# Patient Record
Sex: Female | Born: 1980 | Race: White | Hispanic: No | Marital: Married | Smoking: Never smoker
Health system: Southern US, Community
[De-identification: ages and names within clinical notes are randomized; demographics above are authoritative.]

## PROBLEM LIST (undated history)

## (undated) DIAGNOSIS — R0600 Dyspnea, unspecified: Secondary | ICD-10-CM

## (undated) DIAGNOSIS — F32A Depression, unspecified: Secondary | ICD-10-CM

## (undated) DIAGNOSIS — I10 Essential (primary) hypertension: Secondary | ICD-10-CM

## (undated) DIAGNOSIS — F419 Anxiety disorder, unspecified: Secondary | ICD-10-CM

## (undated) DIAGNOSIS — K589 Irritable bowel syndrome without diarrhea: Secondary | ICD-10-CM

## (undated) DIAGNOSIS — K219 Gastro-esophageal reflux disease without esophagitis: Secondary | ICD-10-CM

## (undated) DIAGNOSIS — F329 Major depressive disorder, single episode, unspecified: Secondary | ICD-10-CM

## (undated) HISTORY — PX: TONSILLECTOMY: SUR1361

## (undated) HISTORY — PX: OTHER SURGICAL HISTORY: SHX169

---
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
## 2013-09-10 ENCOUNTER — Emergency Department (HOSPITAL_COMMUNITY)
Admission: EM | Admit: 2013-09-10 | Discharge: 2013-09-11 | Disposition: A | Discharge status: Home / Self Care | Payer: Medicaid Other | Attending: Emergency Medicine | Admitting: Emergency Medicine

## 2013-09-10 ENCOUNTER — Encounter (HOSPITAL_COMMUNITY): Payer: Self-pay | Admitting: Emergency Medicine

## 2013-09-10 DIAGNOSIS — R110 Nausea: Secondary | ICD-10-CM | POA: Insufficient documentation

## 2013-09-10 DIAGNOSIS — K8020 Calculus of gallbladder without cholecystitis without obstruction: Secondary | ICD-10-CM

## 2013-09-10 DIAGNOSIS — R209 Unspecified disturbances of skin sensation: Secondary | ICD-10-CM | POA: Insufficient documentation

## 2013-09-10 DIAGNOSIS — Z3202 Encounter for pregnancy test, result negative: Secondary | ICD-10-CM | POA: Insufficient documentation

## 2013-09-10 DIAGNOSIS — R109 Unspecified abdominal pain: Secondary | ICD-10-CM | POA: Insufficient documentation

## 2013-09-10 DIAGNOSIS — R202 Paresthesia of skin: Secondary | ICD-10-CM

## 2013-09-10 DIAGNOSIS — Z79899 Other long term (current) drug therapy: Secondary | ICD-10-CM | POA: Insufficient documentation

## 2013-09-10 DIAGNOSIS — R51 Headache: Secondary | ICD-10-CM | POA: Insufficient documentation

## 2013-09-10 DIAGNOSIS — K589 Irritable bowel syndrome without diarrhea: Secondary | ICD-10-CM | POA: Insufficient documentation

## 2013-09-10 DIAGNOSIS — R519 Headache, unspecified: Secondary | ICD-10-CM

## 2013-09-10 DIAGNOSIS — R0789 Other chest pain: Secondary | ICD-10-CM | POA: Insufficient documentation

## 2013-09-10 DIAGNOSIS — I10 Essential (primary) hypertension: Secondary | ICD-10-CM | POA: Insufficient documentation

## 2013-09-10 HISTORY — DX: Essential (primary) hypertension: I10

## 2013-09-10 HISTORY — DX: Irritable bowel syndrome, unspecified: K58.9

## 2013-09-10 LAB — CBC WITH DIFFERENTIAL/PLATELET
Basophils Absolute: 0 10*3/uL (ref 0.0–0.1)
Basophils Relative: 0 % (ref 0–1)
EOS ABS: 0 10*3/uL (ref 0.0–0.7)
Eosinophils Relative: 0 % (ref 0–5)
HCT: 37 % (ref 36.0–46.0)
Hemoglobin: 13 g/dL (ref 12.0–15.0)
LYMPHS ABS: 1.9 10*3/uL (ref 0.7–4.0)
Lymphocytes Relative: 17 % (ref 12–46)
MCH: 32.1 pg (ref 26.0–34.0)
MCHC: 35.1 g/dL (ref 30.0–36.0)
MCV: 91.4 fL (ref 78.0–100.0)
MONOS PCT: 7 % (ref 3–12)
Monocytes Absolute: 0.7 10*3/uL (ref 0.1–1.0)
Neutro Abs: 8.1 10*3/uL — ABNORMAL HIGH (ref 1.7–7.7)
Neutrophils Relative %: 76 % (ref 43–77)
PLATELETS: 303 10*3/uL (ref 150–400)
RBC: 4.05 MIL/uL (ref 3.87–5.11)
RDW: 12.3 % (ref 11.5–15.5)
WBC: 10.7 10*3/uL — ABNORMAL HIGH (ref 4.0–10.5)

## 2013-09-10 LAB — URINALYSIS, ROUTINE W REFLEX MICROSCOPIC
Glucose, UA: NEGATIVE mg/dL
Ketones, ur: >80 mg/dL — AB
NITRITE: NEGATIVE
PROTEIN: NEGATIVE mg/dL
SPECIFIC GRAVITY, URINE: 1.021 (ref 1.005–1.030)
UROBILINOGEN UA: 0.2 mg/dL (ref 0.0–1.0)
pH: 5.5 (ref 5.0–8.0)

## 2013-09-10 LAB — URINE MICROSCOPIC-ADD ON

## 2013-09-10 LAB — COMPREHENSIVE METABOLIC PANEL
ALT: 12 U/L (ref 0–35)
AST: 17 U/L (ref 0–37)
Albumin: 4.5 g/dL (ref 3.5–5.2)
Alkaline Phosphatase: 50 U/L (ref 39–117)
BUN: 9 mg/dL (ref 6–23)
CALCIUM: 9.4 mg/dL (ref 8.4–10.5)
CO2: 24 mEq/L (ref 19–32)
Chloride: 99 mEq/L (ref 96–112)
Creatinine, Ser: 0.58 mg/dL (ref 0.50–1.10)
GLUCOSE: 110 mg/dL — AB (ref 70–99)
Potassium: 4.1 mEq/L (ref 3.7–5.3)
SODIUM: 138 meq/L (ref 137–147)
Total Bilirubin: 0.5 mg/dL (ref 0.3–1.2)
Total Protein: 8.3 g/dL (ref 6.0–8.3)

## 2013-09-10 LAB — POCT I-STAT TROPONIN I: Troponin i, poc: 0 ng/mL (ref 0.00–0.08)

## 2013-09-10 LAB — POCT PREGNANCY, URINE
Preg Test, Ur: NEGATIVE
Preg Test, Ur: NEGATIVE

## 2013-09-10 LAB — LIPASE, BLOOD: LIPASE: 26 U/L (ref 11–59)

## 2013-09-10 MED ORDER — SODIUM CHLORIDE 0.9 % IV SOLN
1000.0000 mL | Freq: Once | INTRAVENOUS | Status: AC
  Administered 2013-09-11: 1000 mL via INTRAVENOUS

## 2013-09-10 MED ORDER — SODIUM CHLORIDE 0.9 % IV SOLN: 1000.0000 mL | INTRAVENOUS | Status: DC

## 2013-09-10 MED ORDER — ONDANSETRON HCL 4 MG/2ML IJ SOLN
4.0000 mg | Freq: Once | INTRAMUSCULAR | Status: AC
  Administered 2013-09-11: 4 mg via INTRAVENOUS
  Filled 2013-09-10: qty 2

## 2013-09-10 NOTE — ED Notes (Signed)
Pt. Reports intermittent  pain " burning / feels like my inside is on fire " at mid abdomen radiating up her chest and head for 9 days , refused EKG , pt. also reported nausea , vomitting and diarrhea . [facility] - results still pending .

## 2013-09-10 NOTE — ED Notes (Signed)
Pt unable to void for UA at this time

## 2013-09-11 ENCOUNTER — Emergency Department (HOSPITAL_COMMUNITY): Payer: Medicaid Other

## 2013-09-11 MED ORDER — GI COCKTAIL ~~LOC~~
30.0000 mL | Freq: Once | ORAL | Status: AC
  Administered 2013-09-11: 30 mL via ORAL
  Filled 2013-09-11: qty 30

## 2013-09-11 MED ORDER — GABAPENTIN 300 MG PO CAPS: 300.0000 mg | ORAL_CAPSULE | Freq: Three times a day (TID) | ORAL | Status: AC

## 2013-09-11 MED ORDER — ONDANSETRON 8 MG PO TBDP: 8.0000 mg | ORAL_TABLET | Freq: Three times a day (TID) | ORAL | Status: AC | PRN

## 2013-09-11 MED ORDER — LORAZEPAM 1 MG PO TABS: 1.0000 mg | ORAL_TABLET | Freq: Every evening | ORAL | Status: AC | PRN

## 2013-09-11 MED ORDER — LORAZEPAM 1 MG PO TABS
1.0000 mg | ORAL_TABLET | Freq: Once | ORAL | Status: AC
  Administered 2013-09-11: 1 mg via ORAL
  Filled 2013-09-11: qty 1

## 2013-09-11 MED ORDER — HYOSCYAMINE SULFATE 0.125 MG SL SUBL: 0.1250 mg | SUBLINGUAL_TABLET | SUBLINGUAL | Status: AC | PRN

## 2013-09-11 MED ORDER — SUCRALFATE 1 G PO TABS: 1.0000 g | ORAL_TABLET | Freq: Four times a day (QID) | ORAL | Status: AC

## 2013-09-11 MED ORDER — HYOSCYAMINE SULFATE 0.125 MG SL SUBL
0.1250 mg | SUBLINGUAL_TABLET | Freq: Once | SUBLINGUAL | Status: AC
  Administered 2013-09-11: 0.125 mg via SUBLINGUAL

## 2013-09-11 MED ORDER — GABAPENTIN 300 MG PO CAPS
300.0000 mg | ORAL_CAPSULE | Freq: Every day | ORAL | Status: DC
  Administered 2013-09-11: 300 mg via ORAL
  Filled 2013-09-11: qty 1

## 2013-09-11 MED ORDER — HYOSCYAMINE SULFATE 0.125 MG PO TABS: 0.1250 mg | ORAL_TABLET | Freq: Every day | ORAL | Status: DC

## 2013-09-11 MED ORDER — HYOSCYAMINE SULFATE 0.125 MG PO TABS
0.1250 mg | ORAL_TABLET | Freq: Once | ORAL | Status: DC
  Filled 2013-09-11: qty 1

## 2013-09-11 NOTE — ED Notes (Signed)
Pt reports burning to face, hands and legs continues.

## 2013-09-11 NOTE — Discharge Instructions (Signed)
At this time.  It is unclear what is causing your symptoms.  Your ultrasound showed that you do have a possible gallstone.  Given your abdominal symptoms, it is important for you to followup with a gastroenterologist for further workup.  Given your numbness, and head pain, it is important to followup with a neurologist.   Abdominal Pain, Adult Many things can cause abdominal pain. Usually, abdominal pain is not caused by a disease and will improve without treatment. It can often be observed and treated at home. Your health care provider will do a physical exam and possibly order blood tests and X-rays to help determine the seriousness of your pain. However, in many cases, more time must pass before a clear cause of the pain can be found. Before that point, your health care provider may not know if you need more testing or further treatment. HOME CARE INSTRUCTIONS  Monitor your abdominal pain for any changes. The following actions may help to alleviate any discomfort you are experiencing:  Only take over-the-counter or prescription medicines as directed by your health care provider.  Do not take laxatives unless directed to do so by your health care provider.  Try a clear liquid diet (broth, tea, or water) as directed by your health care provider. Slowly move to a bland diet as tolerated. SEEK MEDICAL CARE IF:  You have unexplained abdominal pain.  You have abdominal pain associated with nausea or diarrhea.  You have pain when you urinate or have a bowel movement.  You experience abdominal pain that wakes you in the night.  You have abdominal pain that is worsened or improved by eating food.  You have abdominal pain that is worsened with eating fatty foods. SEEK IMMEDIATE MEDICAL CARE IF:   Your pain does not go away within 2 hours.  You have a fever.  You keep throwing up (vomiting).  Your pain is felt only in portions of the abdomen, such as the right side or the left lower portion  of the abdomen.  You pass bloody or black tarry stools. MAKE SURE YOU:  Understand these instructions.   Will watch your condition.   Will get help right away if you are not doing well or get worse.  Document Released: 05/02/2005 Document Revised: 05/13/2013 Document Reviewed: 04/01/2013 [facility] Patient Information 2014 [employer] [place].  Cholelithiasis Cholelithiasis (also called gallstones) is a form of gallbladder disease in which gallstones form in your gallbladder. The gallbladder is an organ that stores bile made in the liver, which helps digest fats. Gallstones begin as small crystals and slowly grow into stones. Gallstone pain occurs when the gallbladder spasms and a gallstone is blocking the duct. Pain can also occur when a stone passes out of the duct.  RISK FACTORS  Being female.   Having multiple pregnancies. Health care providers sometimes advise removing diseased gallbladders before future pregnancies.   Being obese.  Eating a diet heavy in fried foods and fat.   Being older than 60 years and increasing age.   Prolonged use of medicines containing female hormones.   Having diabetes mellitus.   Rapidly losing weight.   Having a family history of gallstones (heredity).  SYMPTOMS  Nausea.   Vomiting.  Abdominal pain.   Yellowing of the skin (jaundice).   Sudden pain. It may persist from several minutes to several hours.  Fever.   Tenderness to the touch. In some cases, when gallstones do not move into the bile duct, people have no pain or  symptoms. These are called "silent" gallstones.  TREATMENT Silent gallstones do not need treatment. In severe cases, emergency surgery may be required. Options for treatment include:  Surgery to remove the gallbladder. This is the most common treatment.  Medicines. These do not always work and may take 6 12 months or more to work.  Shock wave treatment (extracorporeal biliary lithotripsy). In this  treatment an ultrasound machine sends shock waves to the gallbladder to break gallstones into smaller pieces that can pass into the intestines or be dissolved by medicine. HOME CARE INSTRUCTIONS   Only take over-the-counter or prescription medicines for pain, discomfort, or fever as directed by your health care provider.   Follow a low-fat diet until seen again by your health care provider. Fat causes the gallbladder to contract, which can result in pain.   Follow up with your health care provider as directed. Attacks are almost always recurrent and surgery is usually required for permanent treatment.  SEEK IMMEDIATE MEDICAL CARE IF:   Your pain increases and is not controlled by medicines.   You have a fever or persistent symptoms for more than 2 3 days.   You have a fever and your symptoms suddenly get worse.   You have persistent nausea and vomiting.  MAKE SURE YOU:   Understand these instructions.  Will watch your condition.  Will get help right away if you are not doing well or get worse. Document Released: 07/19/2005 Document Revised: 03/25/2013 Document Reviewed: 01/14/2013 [facility] Patient Information 2014 [place], [place].  Pain of Unknown Etiology (Pain Without a Known Cause) You have come to your caregiver because of pain. Pain can occur in any part of the body. Often there is not a definite cause. If your laboratory (blood or urine) work was normal and X-rays or other studies were normal, your caregiver may treat you without knowing the cause of the pain. An example of this is the headache. Most headaches are diagnosed by taking a history. This means your caregiver asks you questions about your headaches. Your caregiver determines a treatment based on your answers. Usually testing done for headaches is normal. Often testing is not done unless there is no response to medications. Regardless of where your pain is located today, you can be given medications to make you  comfortable. If no physical cause of pain can be found, most cases of pain will gradually leave as suddenly as they came.  If you have a painful condition and no reason can be found for the pain, it is important that you follow up with your caregiver. If the pain becomes worse or does not go away, it may be necessary to repeat tests and look further for a possible cause.  Only take over-the-counter or prescription medicines for pain, discomfort, or fever as directed by your caregiver.  For the protection of your privacy, test results cannot be given over the phone. Make sure you receive the results of your test. Ask how these results are to be obtained if you have not been informed. It is your responsibility to obtain your test results.  You may continue all activities unless the activities cause more pain. When the pain lessens, it is important to gradually resume normal activities. Resume activities by beginning slowly and gradually increasing the intensity and duration of the activities or exercise. During periods of severe pain, bed rest may be helpful. Lie or sit in any position that is comfortable.  Ice used for acute (sudden) conditions may be effective. Use  a large plastic bag filled with ice and wrapped in a towel. This may provide pain relief.  See your caregiver for continued problems. Your caregiver can help or refer you for exercises or physical therapy if necessary. If you were given medications for your condition, do not drive, operate machinery or power tools, or sign legal documents for 24 hours. Do not drink alcohol, take sleeping pills, or take other medications that may interfere with treatment. See your caregiver immediately if you have pain that is becoming worse and not relieved by medications. Document Released: 04/17/2001 Document Revised: 05/13/2013 Document Reviewed: 07/23/2005 [facility] Patient Information 2014 [place], [place].  Paresthesia Paresthesia is an abnormal  burning or prickling sensation. This sensation is generally felt in the hands, arms, legs, or feet. However, it may occur in any part of the body. It is usually not painful. The feeling may be described as:  Tingling or numbness.  "Pins and needles."  Skin crawling.  Buzzing.  Limbs "falling asleep."  Itching. Most people experience temporary (transient) paresthesia at some time in their lives. CAUSES  Paresthesia may occur when you breathe too quickly (hyperventilation). It can also occur without any apparent cause. Commonly, paresthesia occurs when pressure is placed on a nerve. The feeling quickly goes away once the pressure is removed. For some people, however, paresthesia is a long-lasting (chronic) condition caused by an underlying disorder. The underlying disorder may be:  A traumatic, direct injury to nerves. Examples include a:  Broken (fractured) neck.  Fractured skull.  A disorder affecting the brain and spinal cord (central nervous system). Examples include:  Transverse myelitis.  Encephalitis.  Transient ischemic attack.  Multiple sclerosis.  Stroke.  Tumor or blood vessel problems, such as an arteriovenous malformation pressing against the brain or spinal cord.  A condition that damages the peripheral nerves (peripheral neuropathy). Peripheral nerves are not part of the brain and spinal cord. These conditions include:  Diabetes.  Peripheral vascular disease.  Nerve entrapment syndromes, such as carpal tunnel syndrome.  Shingles.  Hypothyroidism.  Vitamin B12 deficiencies.  Alcoholism.  Heavy metal poisoning (lead, arsenic).  Rheumatoid arthritis.  Systemic lupus erythematosus. DIAGNOSIS  Your caregiver will attempt to find the underlying cause of your paresthesia. Your caregiver may:  Take your medical history.  Perform a physical exam.  Order various lab tests.  Order imaging tests. TREATMENT  Treatment for paresthesia depends on the  underlying cause. HOME CARE INSTRUCTIONS  Avoid drinking alcohol.  You may consider massage or acupuncture to help relieve your symptoms.  Keep all follow-up appointments as directed by your caregiver. SEEK IMMEDIATE MEDICAL CARE IF:   You feel weak.  You have trouble walking or moving.  You have problems with speech or vision.  You feel confused.  You cannot control your bladder or bowel movements.  You feel numbness after an injury.  You faint.  Your burning or prickling feeling gets worse when walking.  You have pain, cramps, or dizziness.  You develop a rash. MAKE SURE YOU:  Understand these instructions.  Will watch your condition.  Will get help right away if you are not doing well or get worse. Document Released: 07/13/2002 Document Revised: 10/15/2011 Document Reviewed: 04/13/2011 [facility] Patient Information 2014 [place], [place].

## 2013-09-11 NOTE — ED Provider Notes (Signed)
CSN: [id number]     Arrival date & time 09/10/13  1942 History   First MD Initiated Contact with Patient 09/10/13 2343     Chief Complaint  Patient presents with  . Abdominal Pain   (Consider location/radiation/quality/duration/timing/severity/associated sxs/prior Treatment) HPI 33 yo female presents to the ER with complaint of 8-9 days of burning upper abdominal pain radiating through her chest, and 3 days of burning/fire type pain going from abdomen up through her head.  She was seen by her doctor and [facility] ER for upper abd pain/chest pain, and has had thorough cardiac workup.  Pt has developed episodes of abd/head pain lasting for 5+ hours over the last 3 nights.  The pain is severe, and keeps her from sleeping.  Her husband reports during these episodes she won't communicate, she reports it is due to the severe pain.  Pain is sharp and electric like at the back of her skull/upper neck, and radiates around.  With this pain, she has numbness to the left side of her face.  She has similar diffuse burning/electric pain to her abdomen at the same time as her head.  She has h/o migraines, but nothing like this most recent pain.  No fevers, chills.  She has had nausea, but no vomiting.  No trauma to head, neck.  Pt feels it may be her gallbladder, and is frustrated her PCM has not done u/s as of yet. Past Medical History  Diagnosis Date  . Hypertension   . IBS (irritable bowel syndrome)    Past Surgical History  Procedure Laterality Date  . Tonsillectomy    . Congenital heart surgery     No family history on file. History  Substance Use Topics  . Smoking status: Never Smoker   . Smokeless tobacco: Not on file  . Alcohol Use: No   OB History   Grav Para Term Preterm Abortions TAB SAB Ect Mult Living                 Review of Systems  See History of Present Illness; otherwise all other systems are reviewed and negative Allergies  Sulfur  Home Medications   Current Outpatient Rx   Name  Route  Sig  Dispense  Refill  . metoprolol succinate (TOPROL-XL) 50 MG 24 hr tablet   Oral   Take 50 mg by mouth daily. Take with or immediately following a meal.         . omeprazole (PRILOSEC) 20 MG capsule   Oral   Take 20 mg by mouth daily.          BP 133/69  Pulse 91  Temp(Src) 98.3 F (36.8 C) (Oral)  Resp 22  SpO2 100% Physical Exam  Nursing note and vitals reviewed. Constitutional: She is oriented to person, place, and time. She appears well-developed and well-nourished.  HENT:  Head: Normocephalic and atraumatic.  Nose: Nose normal.  Mouth/Throat: Oropharynx is clear and moist.  Eyes: Conjunctivae and EOM are normal. Pupils are equal, round, and reactive to light.  Neck: Normal range of motion. Neck supple. No JVD present. No tracheal deviation present. No thyromegaly present.  Cardiovascular: Normal rate, regular rhythm, normal heart sounds and intact distal pulses.  Exam reveals no gallop and no friction rub.   No murmur heard. Pulmonary/Chest: Effort normal and breath sounds normal. No stridor. No respiratory distress. She has no wheezes. She has no rales. She exhibits no tenderness.  Abdominal: Soft. Bowel sounds are normal. She exhibits no distension  and no mass. There is no tenderness. There is no rebound and no guarding.  Musculoskeletal: Normal range of motion. She exhibits no edema and no tenderness.  Lymphadenopathy:    She has no cervical adenopathy.  Neurological: She is alert and oriented to person, place, and time. She has normal reflexes. No cranial nerve deficit. She exhibits normal muscle tone. Coordination normal.  Skin: Skin is warm and dry. No rash noted. No erythema. No pallor.  Psychiatric: Her behavior is normal. Judgment and thought content normal.  Patient with slightly odd affect    ED Course  Procedures (including critical care time) Labs Review Labs Reviewed  CBC WITH DIFFERENTIAL - Abnormal; Notable for the following:     WBC 10.7 (*)    Neutro Abs 8.1 (*)    All other components within normal limits  COMPREHENSIVE METABOLIC PANEL - Abnormal; Notable for the following:    Glucose, Bld 110 (*)    All other components within normal limits  URINALYSIS, ROUTINE W REFLEX MICROSCOPIC - Abnormal; Notable for the following:    APPearance CLOUDY (*)    Hgb urine dipstick SMALL (*)    Bilirubin Urine SMALL (*)    Ketones, ur >80 (*)    Leukocytes, UA MODERATE (*)    All other components within normal limits  URINE MICROSCOPIC-ADD ON - Abnormal; Notable for the following:    Squamous Epithelial / LPF MANY (*)    Bacteria, UA FEW (*)    All other components within normal limits  URINE CULTURE  LIPASE, BLOOD  POCT I-STAT TROPONIN I  POCT PREGNANCY, URINE  POCT PREGNANCY, URINE   Imaging Review US Abdomen Complete  09/11/2013   CLINICAL DATA:  Abdominal pain.  Possible gallstones.  EXAM: ULTRASOUND ABDOMEN COMPLETE  COMPARISON:  None.  FINDINGS: Gallbladder:  There is a 4 mm echogenic, non shadowing structure which is fixed to the posterior wall of the gallbladder. There is an 8 mm rounded echogenic focus which is mobile, non shadowing. No wall thickening or sonographic Murphy sign.  Common bile duct:  Diameter: 3 mm.  Liver:  No focal lesion identified. Within normal limits in parenchymal echogenicity.  IVC:  Appears distended.  Pancreas:  Visualized portion unremarkable.  Spleen:  Size and appearance within normal limits.  Right Kidney:  Length: 11 cm. Normal cortical echogenicity. No hydronephrosis. The medullary pyramids appear mildly echogenic, but not definitive for medullary nephrocalcinosis.  Left Kidney:  Length: 11 cm. Normal cortical echogenicity. No hydronephrosis.The medullary pyramids appear mildly echogenic, but not definitive for medullary nephrocalcinosis.  Abdominal aorta:  No aneurysm visualized.  Other findings:  None.  IMPRESSION: 1. No acute cholecystitis. 2. 8 mm non-typical gallstone or sludge ball.  3. 4 mm gallbladder polyp.   Electronically Signed   By: Tiburcio Pea M.D.   On: 09/11/2013 03:17    EKG Interpretation   None       MDM   1. Abdominal pain   2. Headache   3. Paresthesias   4. Gallbladder stone without cholecystitis or obstruction    33 year old female with 9 days of fire-type pain to chest and abdomen, 3 days of hour long episodes of pain to abdomen and head. I am not sure exactly what symptoms may due to.  Her labs here are unremarkable.  Her exam is unremarkable, she's not currently having symptoms.  She may be having a complex migraine or abdominal migraine.  We can check ultrasound of abdomen for possible gallbladder pathology, though her  symptoms do not seem typical.  Will start patient on Neurontin, and refer to gastroenterology, as well as neurology.  3:38 AM Pt without acute cholecystitis.  U/s with polyp and 8 mm stone vs sludge ball.  Pt requesting admission with "full workup", but no indications for admission at this time.  I am not convinced that GB is source of her neurological symptoms and abd pain.  Pt reports during u/s she had loss of sensation with tingling of both hands when probe placed over her pancreas, now convinced she may have lupus or pancreatitis.  Will continue with current plan of referral to GI and Neuro.    Pollyann Roa M OtterOlivia Mackie, MD 09/11/13 [phone]

## 2013-09-11 NOTE — ED Notes (Addendum)
Up to BR to void, husband accompanied. When asked about feeling s/p GI cocktail, pt unable to give answer other than "I felt it bubble when it got here" pointing to the area just above her umbilicus.

## 2013-09-12 LAB — URINE CULTURE
COLONY COUNT: NO GROWTH
Culture: NO GROWTH

## 2018-06-30 ENCOUNTER — Other Ambulatory Visit (HOSPITAL_COMMUNITY): Payer: Self-pay | Admitting: Family Medicine

## 2018-06-30 DIAGNOSIS — I10 Essential (primary) hypertension: Secondary | ICD-10-CM

## 2018-07-04 ENCOUNTER — Ambulatory Visit (HOSPITAL_COMMUNITY)
Admission: RE | Admit: 2018-07-04 | Discharge: 2018-07-04 | Disposition: A | Discharge status: Home / Self Care | Source: Ambulatory Visit | Attending: Family Medicine | Admitting: Family Medicine

## 2018-07-04 DIAGNOSIS — I10 Essential (primary) hypertension: Secondary | ICD-10-CM | POA: Diagnosis not present

## 2018-07-04 NOTE — Progress Notes (Signed)
*  PRELIMINARY RESULTS* Echocardiogram 2D Echocardiogram has been performed.  Jeryl Columbialliott, Reymundo Winship 07/04/2018, 10:59 AM

## 2018-07-09 ENCOUNTER — Other Ambulatory Visit (HOSPITAL_COMMUNITY): Payer: Self-pay | Admitting: Family Medicine

## 2018-07-09 DIAGNOSIS — R202 Paresthesia of skin: Secondary | ICD-10-CM

## 2018-07-11 ENCOUNTER — Ambulatory Visit (HOSPITAL_COMMUNITY)
Admission: RE | Admit: 2018-07-11 | Discharge: 2018-07-11 | Disposition: A | Discharge status: Home / Self Care | Source: Ambulatory Visit | Attending: Family Medicine | Admitting: Family Medicine

## 2018-07-11 DIAGNOSIS — R202 Paresthesia of skin: Secondary | ICD-10-CM

## 2018-09-08 ENCOUNTER — Other Ambulatory Visit (HOSPITAL_COMMUNITY): Payer: Self-pay | Admitting: Neurosurgery

## 2018-09-08 ENCOUNTER — Other Ambulatory Visit: Payer: Self-pay | Admitting: Neurosurgery

## 2018-09-08 DIAGNOSIS — M5412 Radiculopathy, cervical region: Secondary | ICD-10-CM

## 2018-09-15 ENCOUNTER — Ambulatory Visit (HOSPITAL_COMMUNITY)

## 2018-09-22 ENCOUNTER — Ambulatory Visit (HOSPITAL_COMMUNITY)
Admission: RE | Admit: 2018-09-22 | Discharge: 2018-09-22 | Disposition: A | Discharge status: Home / Self Care | Source: Ambulatory Visit | Attending: Neurosurgery | Admitting: Neurosurgery

## 2018-09-22 ENCOUNTER — Other Ambulatory Visit (HOSPITAL_COMMUNITY): Payer: Self-pay | Admitting: Neurosurgery

## 2018-09-22 DIAGNOSIS — M5412 Radiculopathy, cervical region: Secondary | ICD-10-CM

## 2018-10-06 ENCOUNTER — Other Ambulatory Visit: Payer: Self-pay | Admitting: Neurosurgery

## 2018-10-10 NOTE — Pre-Procedure Instructions (Signed)
Saharra Calcote  10/10/2018     [facility] [address] [place] - [address] [place] [address] Phone: [phone] Fax: [phone]   Your procedure is scheduled on  Friday, October 17, 2018  Report to [facility] Entrance A at 6:00 A.M.  Call this number if you have problems the morning of surgery:  [phone]   Remember:  Do not eat or drink after midnight.   Take these medicines the morning of surgery with A SIP OF WATER  metoprolol succinate (TOPROL-XL)   If needed: LORazepam (ATIVAN)  Follow your surgeon's instructions on when to stop Aspirin.  If no instructions were given by your surgeon then you will need to call the office to get those instructions.    7 days prior to surgery STOP taking any Aspirin (unless otherwise instructed by your surgeon), Aleve, Naproxen, Ibuprofen, Motrin, Advil, Goody's, BC's, all herbal medications, fish oil, and all vitamins.   Do not wear jewelry, make-up or nail polish.  Do not wear lotions, powders, or perfumes, or deodorant.  Do not shave 48 hours prior to surgery.    Do not bring valuables to the hospital.  [facility] is not responsible for any belongings or valuables.  Contacts, dentures or bridgework may not be worn into surgery.  Leave your suitcase in the car.  After surgery it may be brought to your room.  For patients admitted to the hospital, discharge time will be determined by your treatment team.  Patients discharged the day of surgery will not be allowed to drive home.   Special instructions:    [place]- Preparing For Surgery  Before surgery, you can play an important role. Because skin is not sterile, your skin needs to be as free of germs as possible. You can reduce the number of germs on your skin by washing with CHG (chlorahexidine gluconate) Soap before surgery.  CHG is an antiseptic cleaner which kills germs and bonds with the skin to continue killing germs even after washing.    Oral  Hygiene is also important to reduce your risk of infection.  Remember - BRUSH YOUR TEETH THE MORNING OF SURGERY WITH YOUR REGULAR TOOTHPASTE  Please do not use if you have an allergy to CHG or antibacterial soaps. If your skin becomes reddened/irritated stop using the CHG.  Do not shave (including legs and underarms) for at least 48 hours prior to first CHG shower. It is OK to shave your face.  Please follow these instructions carefully.   1. Shower the NIGHT BEFORE SURGERY and the MORNING OF SURGERY with CHG.   2. If you chose to wash your hair, wash your hair first as usual with your normal shampoo.  3. After you shampoo, rinse your hair and body thoroughly to remove the shampoo.  4. Use CHG as you would any other liquid soap. You can apply CHG directly to the skin and wash gently with a scrungie or a clean washcloth.   5. Apply the CHG Soap to your body ONLY FROM THE NECK DOWN.  Do not use on open wounds or open sores. Avoid contact with your eyes, ears, mouth and genitals (private parts). Wash Face and genitals (private parts)  with your normal soap.  6. Wash thoroughly, paying special attention to the area where your surgery will be performed.  7. Thoroughly rinse your body with warm water from the neck down.  8. DO NOT shower/wash with your normal soap after  using and rinsing off the CHG Soap.  9. Pat yourself dry with a CLEAN TOWEL.  10. Wear CLEAN PAJAMAS to bed the night before surgery, wear comfortable clothes the morning of surgery  11. Place CLEAN SHEETS on your bed the night of your first shower and DO NOT SLEEP WITH PETS.  Day of Surgery:  Do not apply any deodorants/lotions.  Please wear clean clothes to the hospital/surgery center.   Remember to brush your teeth WITH YOUR REGULAR TOOTHPASTE.  Please read over the following fact sheets that you were given. Pain Booklet, Coughing and Deep Breathing, MRSA Information and Surgical Site Infection Prevention

## 2018-10-13 ENCOUNTER — Encounter (HOSPITAL_COMMUNITY): Payer: Self-pay

## 2018-10-13 ENCOUNTER — Encounter (HOSPITAL_COMMUNITY)
Admission: RE | Admit: 2018-10-13 | Discharge: 2018-10-13 | Disposition: A | Discharge status: Home / Self Care | Source: Ambulatory Visit | Attending: Neurosurgery | Admitting: Neurosurgery

## 2018-10-13 ENCOUNTER — Other Ambulatory Visit: Payer: Self-pay

## 2018-10-13 DIAGNOSIS — Z01818 Encounter for other preprocedural examination: Secondary | ICD-10-CM | POA: Insufficient documentation

## 2018-10-13 DIAGNOSIS — M5020 Other cervical disc displacement, unspecified cervical region: Secondary | ICD-10-CM | POA: Insufficient documentation

## 2018-10-13 DIAGNOSIS — R9431 Abnormal electrocardiogram [ECG] [EKG]: Secondary | ICD-10-CM | POA: Diagnosis not present

## 2018-10-13 HISTORY — DX: Gastro-esophageal reflux disease without esophagitis: K21.9

## 2018-10-13 HISTORY — DX: Depression, unspecified: F32.A

## 2018-10-13 HISTORY — DX: Anxiety disorder, unspecified: F41.9

## 2018-10-13 HISTORY — DX: Major depressive disorder, single episode, unspecified: F32.9

## 2018-10-13 HISTORY — DX: Dyspnea, unspecified: R06.00

## 2018-10-13 LAB — CBC WITH DIFFERENTIAL/PLATELET
Abs Immature Granulocytes: 0.01 10*3/uL (ref 0.00–0.07)
Basophils Absolute: 0 10*3/uL (ref 0.0–0.1)
Basophils Relative: 0 %
Eosinophils Absolute: 0 10*3/uL (ref 0.0–0.5)
Eosinophils Relative: 0 %
HCT: 36.1 % (ref 36.0–46.0)
HEMOGLOBIN: 12.8 g/dL (ref 12.0–15.0)
Immature Granulocytes: 0 %
Lymphocytes Relative: 18 %
Lymphs Abs: 1 10*3/uL (ref 0.7–4.0)
MCH: 33.4 pg (ref 26.0–34.0)
MCHC: 35.5 g/dL (ref 30.0–36.0)
MCV: 94.3 fL (ref 80.0–100.0)
Monocytes Absolute: 0.3 10*3/uL (ref 0.1–1.0)
Monocytes Relative: 6 %
Neutro Abs: 4.4 10*3/uL (ref 1.7–7.7)
Neutrophils Relative %: 76 %
Platelets: 488 10*3/uL — ABNORMAL HIGH (ref 150–400)
RBC: 3.83 MIL/uL — AB (ref 3.87–5.11)
RDW: 11.6 % (ref 11.5–15.5)
WBC: 5.9 10*3/uL (ref 4.0–10.5)
nRBC: 0 % (ref 0.0–0.2)

## 2018-10-13 LAB — BASIC METABOLIC PANEL
Anion gap: 6 (ref 5–15)
BUN: 6 mg/dL (ref 6–20)
CO2: 25 mmol/L (ref 22–32)
Calcium: 9.2 mg/dL (ref 8.9–10.3)
Chloride: 105 mmol/L (ref 98–111)
Creatinine, Ser: 0.71 mg/dL (ref 0.44–1.00)
GFR calc Af Amer: >60 mL/min (ref >60)
GFR calc non Af Amer: >60 mL/min (ref >60)
Glucose, Bld: 103 mg/dL — ABNORMAL HIGH (ref 70–99)
Potassium: 5.2 mmol/L — ABNORMAL HIGH (ref 3.5–5.1)
SODIUM: 136 mmol/L (ref 135–145)

## 2018-10-13 LAB — SURGICAL PCR SCREEN
MRSA, PCR: NEGATIVE
Staphylococcus aureus: NEGATIVE

## 2018-10-13 NOTE — Progress Notes (Signed)
PCP - Otilio Miu[facility] Cardiologist -none   Chest x-ray -  EKG -none  Stress Test -pt. Reports that she had a stress test "many yrs. Ago" doesn't remember where it was done   ECHO - 07/04/2018 Cardiac Cath - n/a  Sleep Study - no- but pt. 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  Echo was done in later 2019 due to vague neck, shoulder, arm pain.   Patient denies shortness of breath, fever, cough and chest pain at PAT appointment   Patient verbalized understanding of instructions that were given to them at the PAT appointment. Patient was also instructed that they will need to review over the PAT instructions again at home before surgery.

## 2018-10-14 ENCOUNTER — Encounter (HOSPITAL_COMMUNITY): Payer: Self-pay | Admitting: Physician Assistant

## 2018-10-14 NOTE — Anesthesia Preprocedure Evaluation (Deleted)
Anesthesia Evaluation    Airway        Dental   Pulmonary           Cardiovascular hypertension,      Neuro/Psych    GI/Hepatic   Endo/Other    Renal/GU      Musculoskeletal   Abdominal   Peds  Hematology   Anesthesia Other Findings   Reproductive/Obstetrics                             Anesthesia Physical Anesthesia Plan  ASA:   Anesthesia Plan:    Post-op Pain Management:    Induction:   PONV Risk Score and Plan:   Airway Management Planned:   Additional Equipment:   Intra-op Plan:   Post-operative Plan:   Informed Consent:   Plan Discussed with:   Anesthesia Plan Comments: (Hx of GAD with intermittent chest pain. Echo ordered by PCP done 07/04/18 showed EF 60-65%, normal wall motion, mild MR)        Anesthesia Quick Evaluation

## 2019-02-13 ENCOUNTER — Encounter (HOSPITAL_COMMUNITY): Admission: RE | Payer: Self-pay | Source: Ambulatory Visit

## 2019-02-13 ENCOUNTER — Ambulatory Visit (HOSPITAL_COMMUNITY): Admission: RE | Admit: 2019-02-13 | Source: Ambulatory Visit | Admitting: Neurosurgery

## 2019-02-13 SURGERY — CERVICAL ANTERIOR DISC ARTHROPLASTY
Anesthesia: General

## 2020-06-30 IMAGING — MR MR CERVICAL SPINE W/O CM
4 of 5 series · 13 of 48 positions shown · non-contrast
Comparison: None.

CLINICAL DATA: Neck and left arm pain for 2 months.  Radiculopathy.

EXAM:
MRI CERVICAL SPINE WITHOUT CONTRAST
TECHNIQUE: Multiplanar, multisequence MR imaging of the cervical spine was
performed. No intravenous contrast was administered.

[Series 3: T2 · sagittal · 3.0mm · 0.29mm/px · 4 of 13 slices shown (1 of 2)]
[im 1/13]
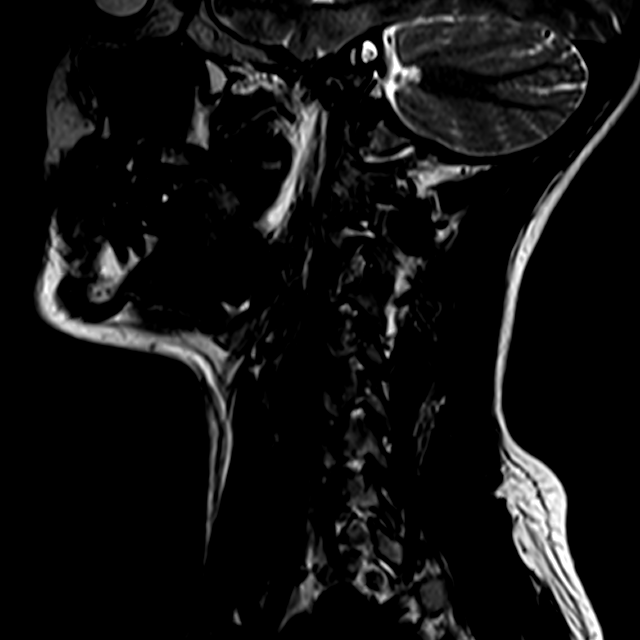
[im 3/13]
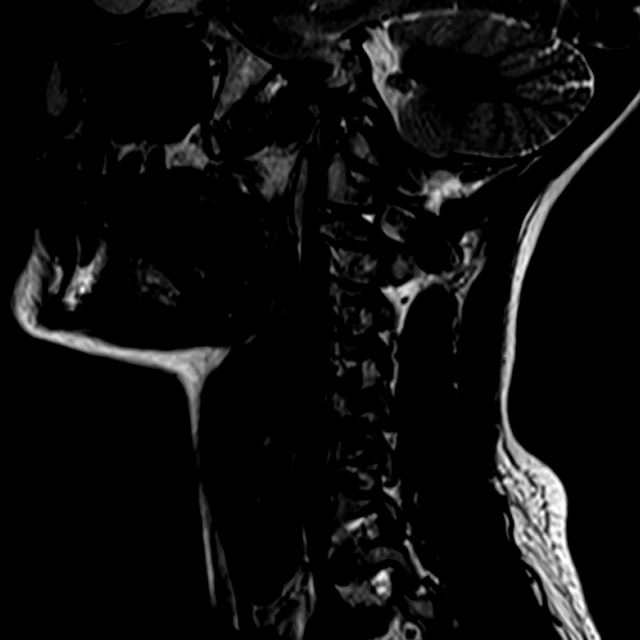
[im 8/13]
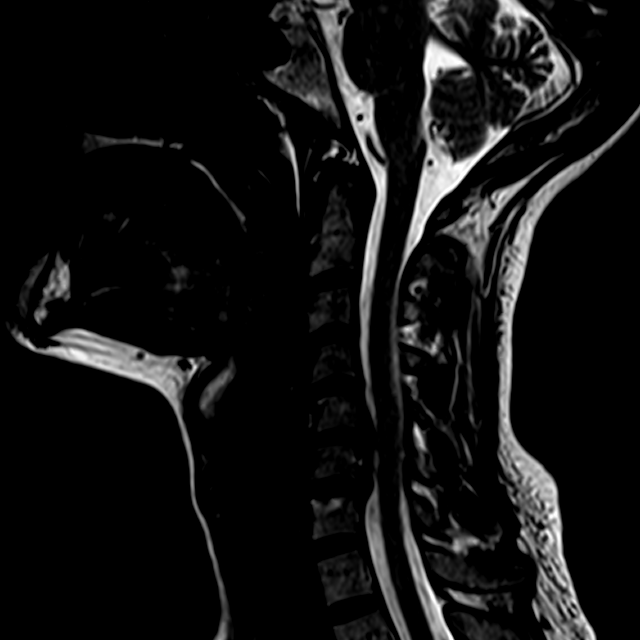
[im 13/13]
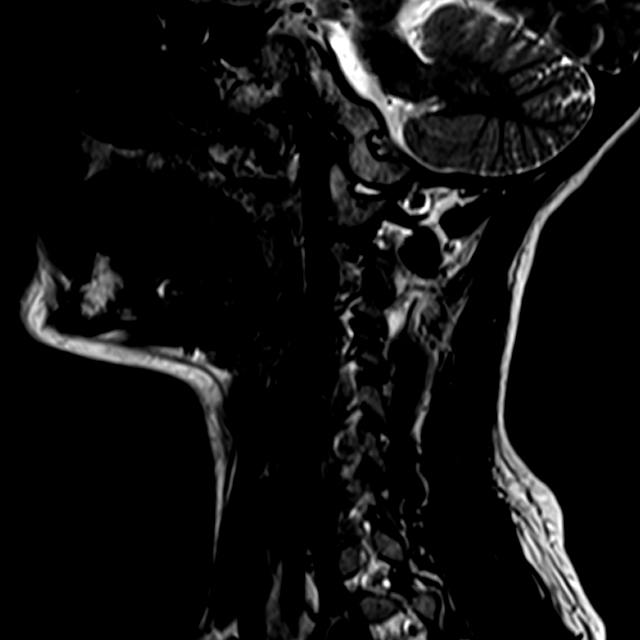

[Series 4: FLAIR · sagittal · 3.0mm · 0.42mm/px · 3 of 13 slices shown]
[im 3/13]
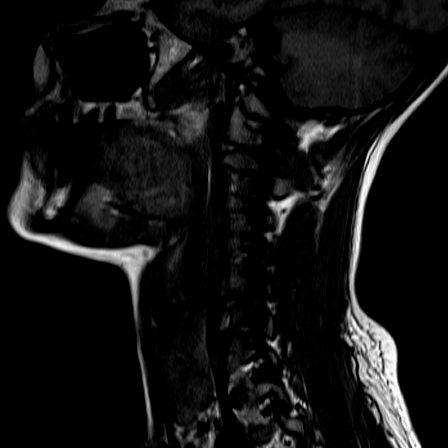
[im 7/13]
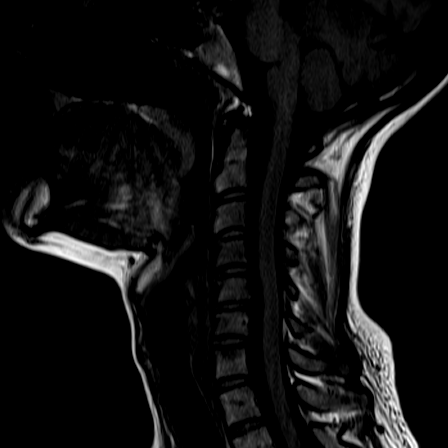
[im 11/13]
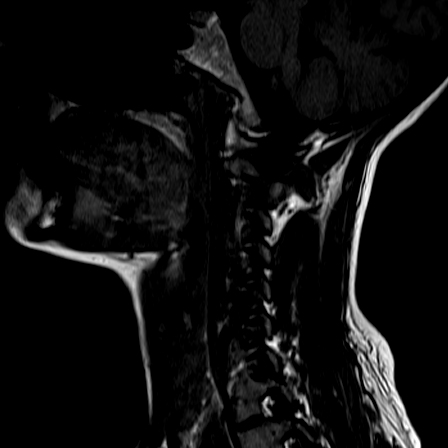

[Series 5: ir sagital · sagittal · 3.0mm · 0.21mm/px · 3 of 13 slices shown]
[im 3/13]
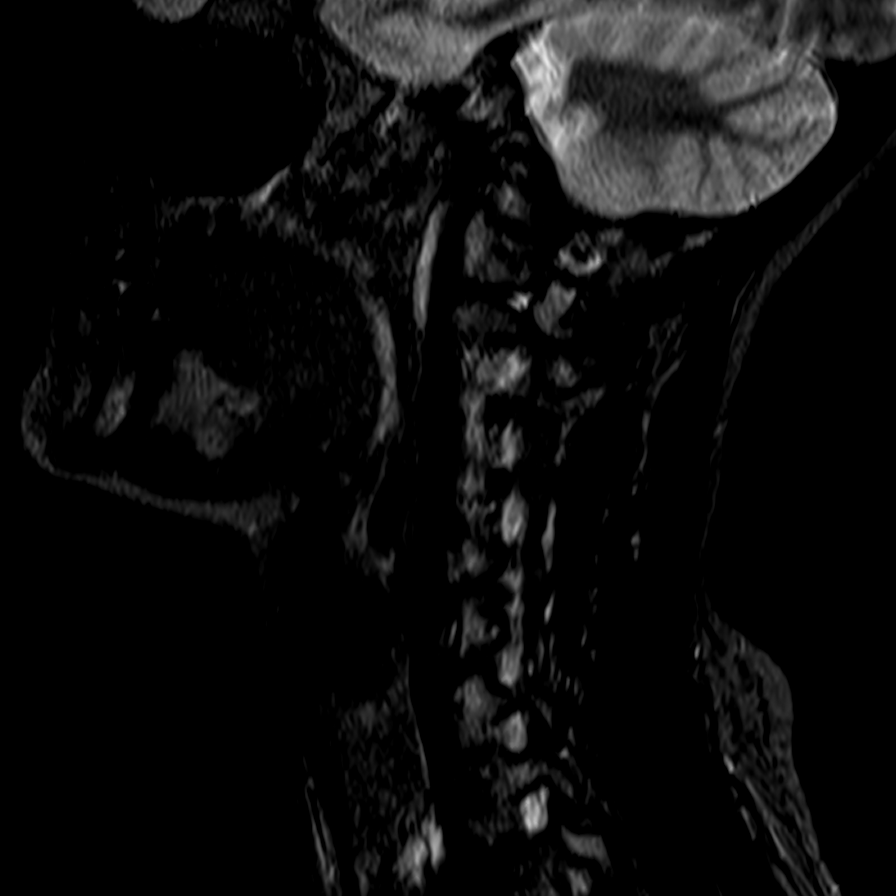
[im 7/13]
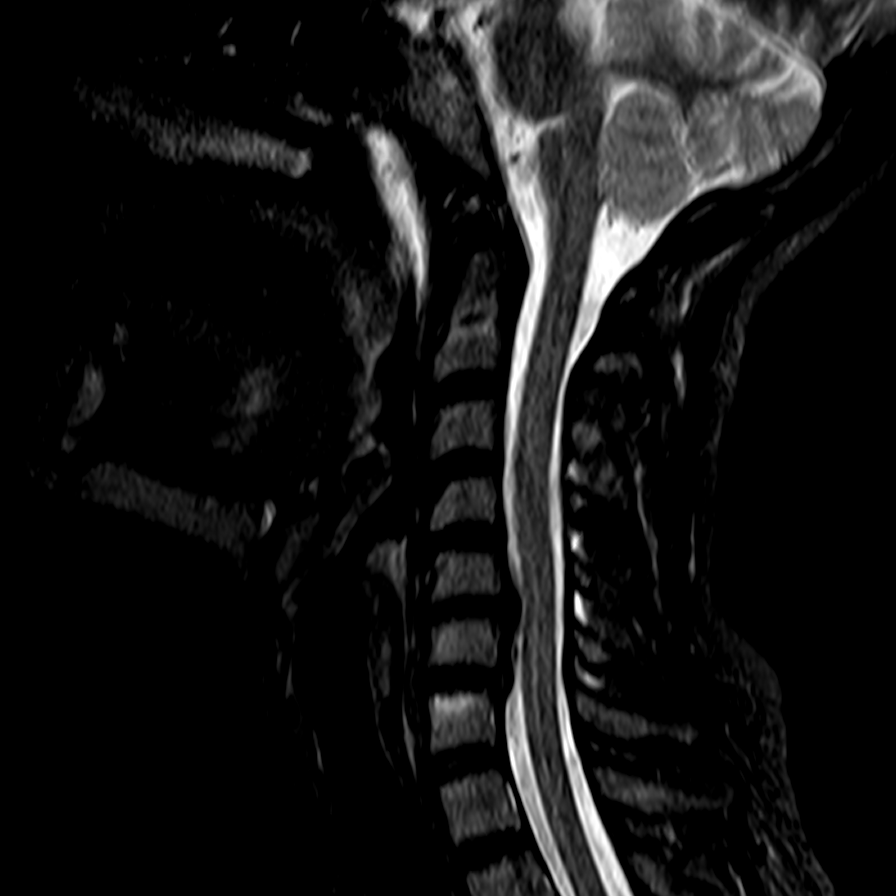
[im 11/13]
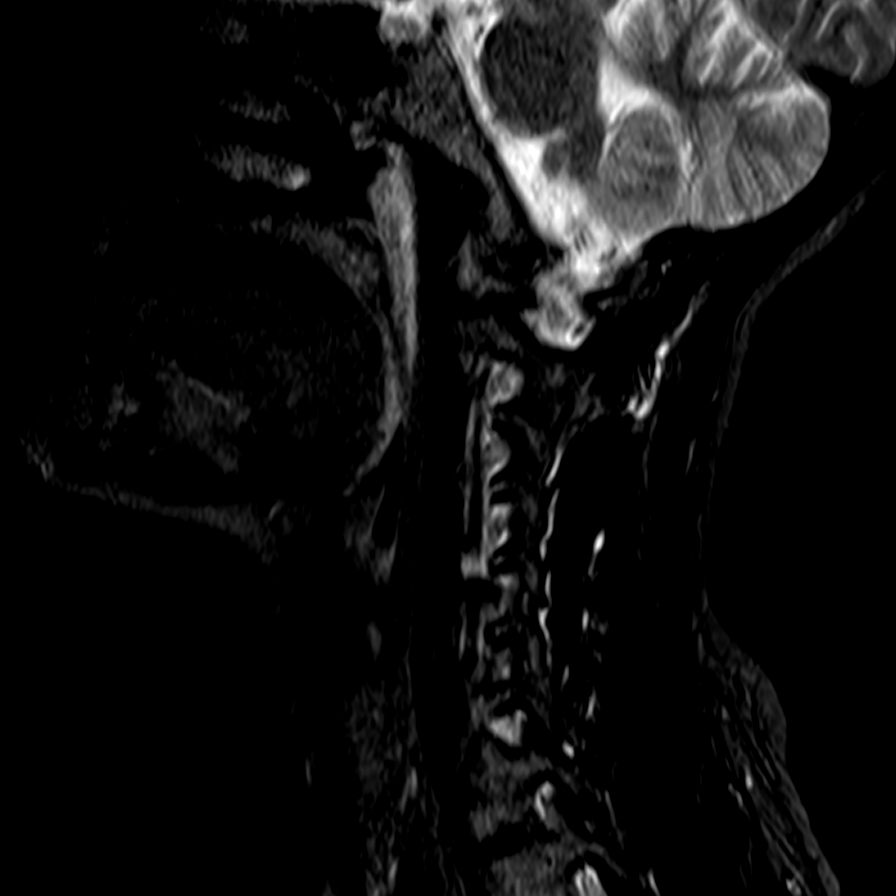

[Series 7: T2 · axial · 3.0mm · 0.21mm/px · z∈[-54,+4]mm · 3 of 28 slices shown (2 of 2)]
[im 5/28]
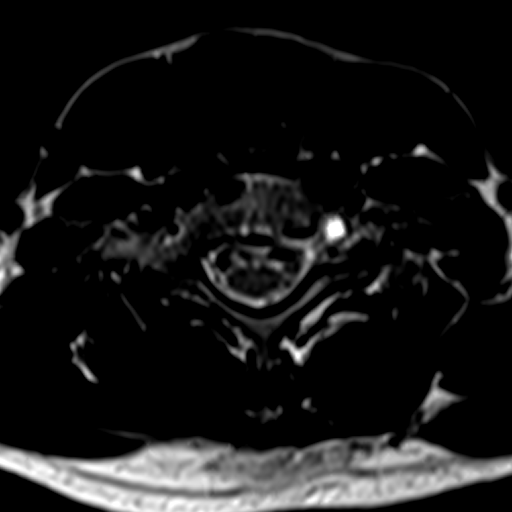
[im 15/28]
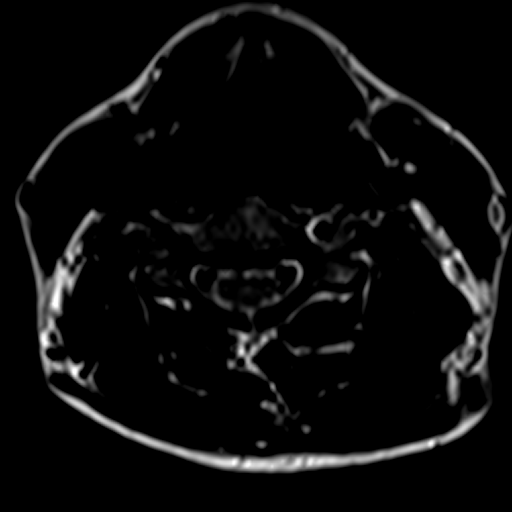
[im 23/28]
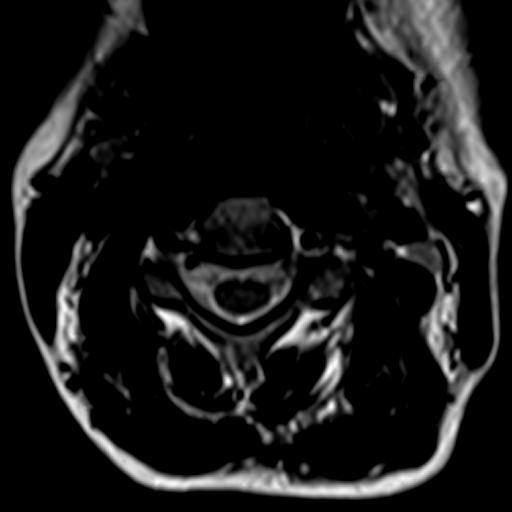

[13 of 48 positions shown; findings below may reference images not displayed]

FINDINGS: Alignment: AP alignment is anatomic. There is straightening of the
normal cervical lordosis.

Vertebrae: Chronic endplate marrow changes are present along the
superior endplate of C7. There are minimal changes at C4-5.

Cord: Normal signal is present in the cervical and upper thoracic
spinal cord to the lowest imaged level, T1-2.

Posterior Fossa, vertebral arteries, paraspinal tissues:
Craniocervical junction is normal. Flow is present in the vertebral
arteries bilaterally. Visualized intracranial contents are normal.

Disc levels:

C1-2: Negative.

C2-3: Negative.

C3-4: Negative.

C4-5: Negative.

C5-6: A broad-based disc osteophyte complex effaces the ventral CSF
and potentially contacts the ventral surface the cord. Moderate
foraminal stenosis is present bilaterally.

C6-7: A shallow left paramedian disc protrusion partially effaces
the ventral CSF. Mild left foraminal narrowing is present.

C7-T1: Negative.
IMPRESSION: 1. Moderate central and bilateral foraminal stenosis at C5-6.
2. Shallow left paramedian disc protrusion at C6-7 with mild left
central and foraminal stenosis.

## 2020-06-30 IMAGING — DX DG CHEST 2V
2 series · 2 of 2 positions shown · non-contrast
Comparison: None.

CLINICAL DATA: MRI clearance.  History of heart surgery.

EXAM:
CHEST - 2 VIEW

[chest pa]
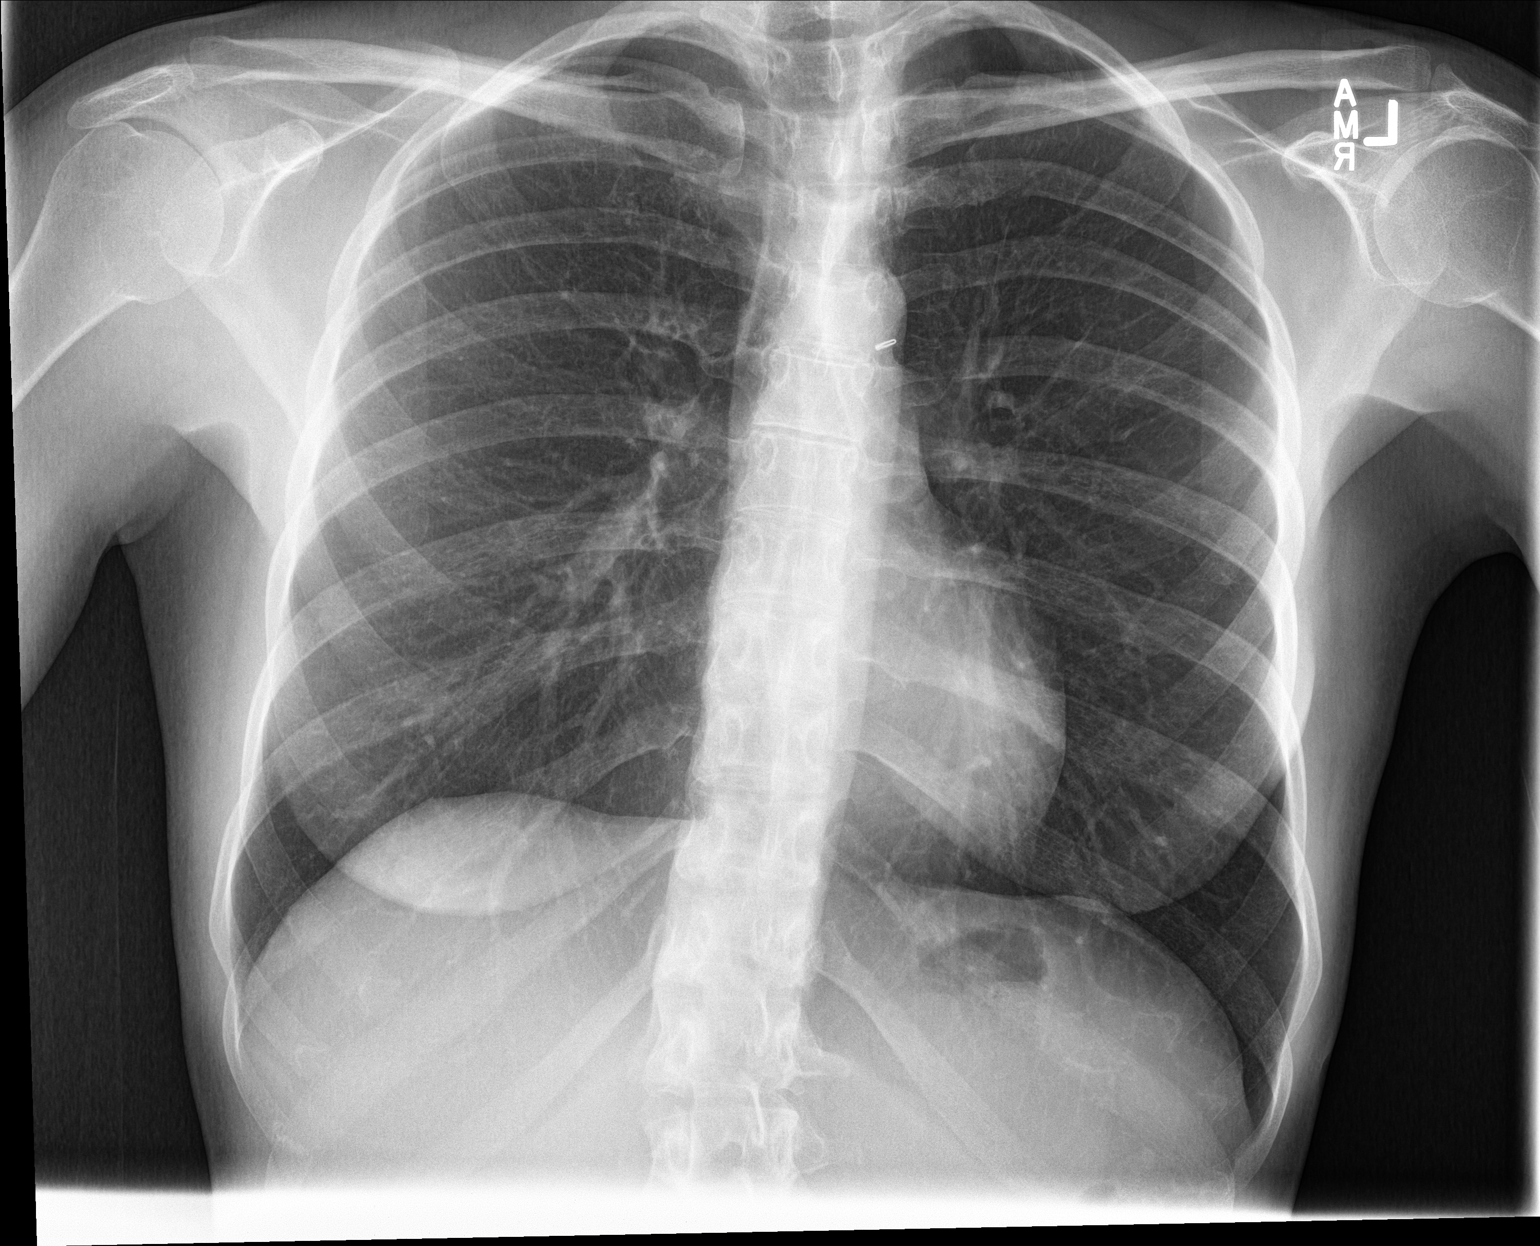

[chest lat]
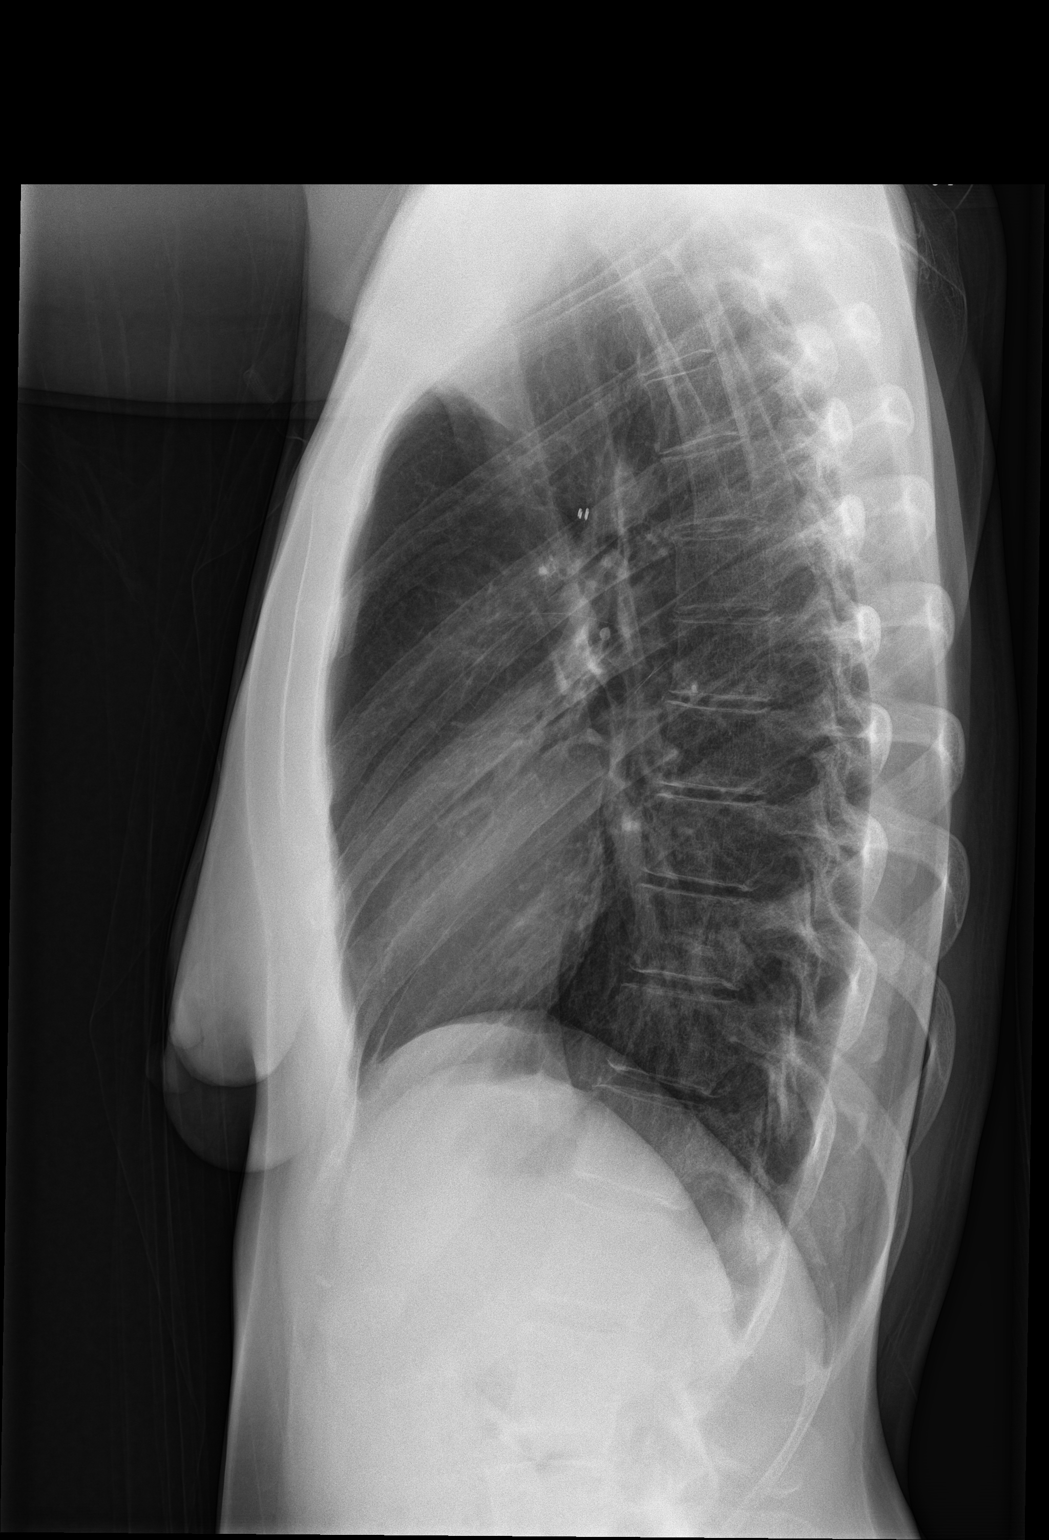

[2 of 2 positions shown; findings below may reference images not displayed]

FINDINGS: The cardiac silhouette, mediastinal and hilar contours are normal.
The lungs are clear. A PDA clip is noted. The bony thorax is normal.
IMPRESSION: No acute cardiopulmonary findings.

Remote PDA clip in place.  Patient is safe for MRI exam.
